# Patient Record
Sex: Female | Born: 2000 | Race: White | Hispanic: No | Marital: Single
Health system: Southern US, Community
[De-identification: ages and names within clinical notes are randomized; demographics above are authoritative.]

## PROBLEM LIST (undated history)

## (undated) ENCOUNTER — Inpatient Hospital Stay (HOSPITAL_COMMUNITY): Payer: Self-pay

## (undated) DIAGNOSIS — R000 Tachycardia, unspecified: Secondary | ICD-10-CM

## (undated) DIAGNOSIS — I951 Orthostatic hypotension: Secondary | ICD-10-CM

## (undated) DIAGNOSIS — F419 Anxiety disorder, unspecified: Secondary | ICD-10-CM

## (undated) DIAGNOSIS — G90A Postural orthostatic tachycardia syndrome (POTS): Secondary | ICD-10-CM

## (undated) DIAGNOSIS — F32A Depression, unspecified: Secondary | ICD-10-CM

## (undated) DIAGNOSIS — I498 Other specified cardiac arrhythmias: Secondary | ICD-10-CM

## (undated) DIAGNOSIS — G43909 Migraine, unspecified, not intractable, without status migrainosus: Secondary | ICD-10-CM

## (undated) DIAGNOSIS — J45909 Unspecified asthma, uncomplicated: Secondary | ICD-10-CM

## (undated) DIAGNOSIS — L509 Urticaria, unspecified: Secondary | ICD-10-CM

## (undated) HISTORY — DX: Anxiety disorder, unspecified: F41.9

## (undated) HISTORY — DX: Unspecified asthma, uncomplicated: J45.909

## (undated) HISTORY — PX: WISDOM TOOTH EXTRACTION: SHX21

## (undated) HISTORY — DX: Migraine, unspecified, not intractable, without status migrainosus: G43.909

## (undated) HISTORY — DX: Depression, unspecified: F32.A

## (undated) HISTORY — DX: Urticaria, unspecified: L50.9

---
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
## 2018-04-07 ENCOUNTER — Encounter (HOSPITAL_COMMUNITY): Payer: Self-pay

## 2018-07-04 IMAGING — US US OB COMP LESS 14 WK
1 series · 14 of 28 positions shown · non-contrast
Comparison: CT abdomen pelvis 04/06/2016.

CLINICAL DATA: Positive urine pregnancy test. Vaginal bleeding.
Lower abdominal pain.

EXAM:
OBSTETRIC <14 WK US AND TRANSVAGINAL OB US
TECHNIQUE: Both transabdominal and transvaginal ultrasound examinations were
performed for complete evaluation of the gestation as well as the
maternal uterus, adnexal regions, and pelvic cul-de-sac.
Transvaginal technique was performed to assess early pregnancy.

[Series 1: us ob comp less 14 wk · 0.20mm/px · 14 of 50 slices shown]
[im 2/50]
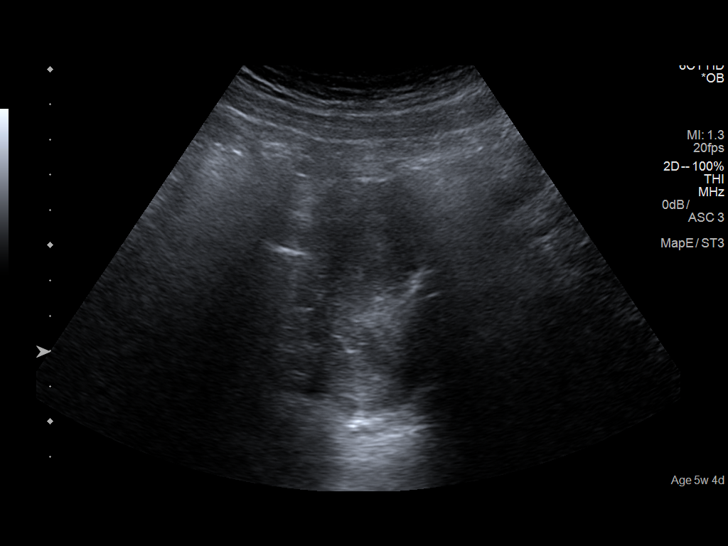
[im 6/50]
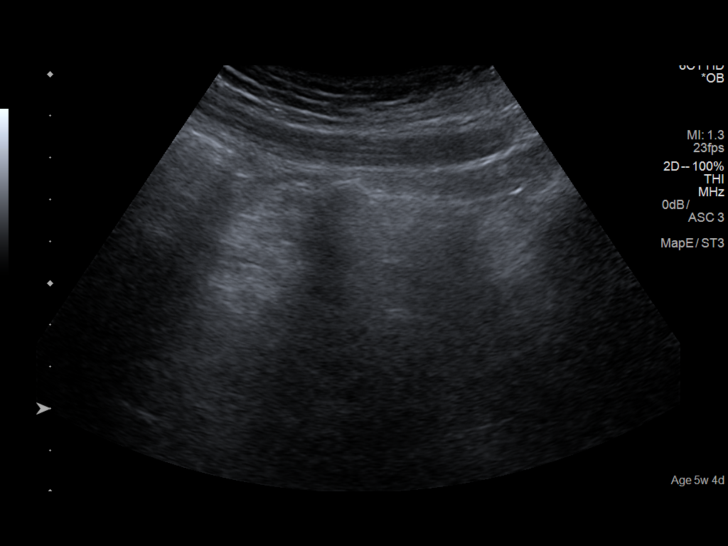
[im 10/50]
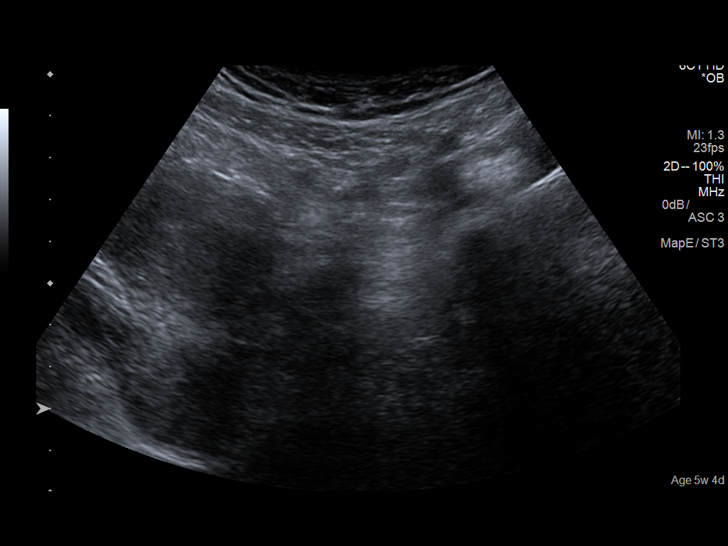
[im 13/50]
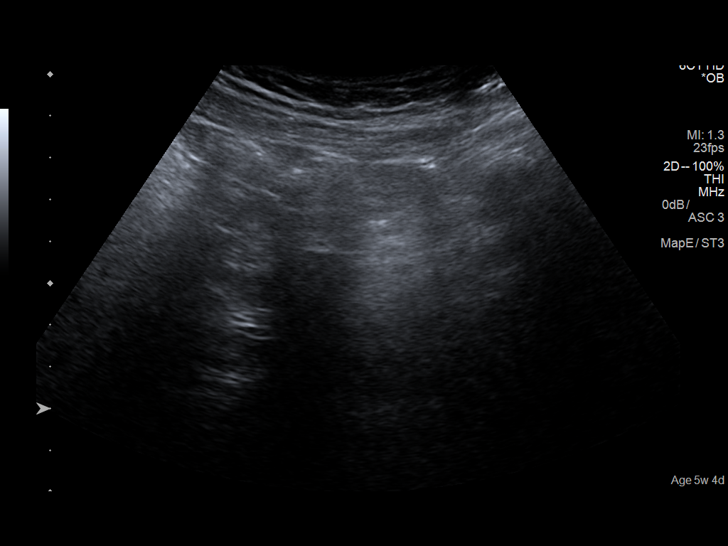
[im 17/50]
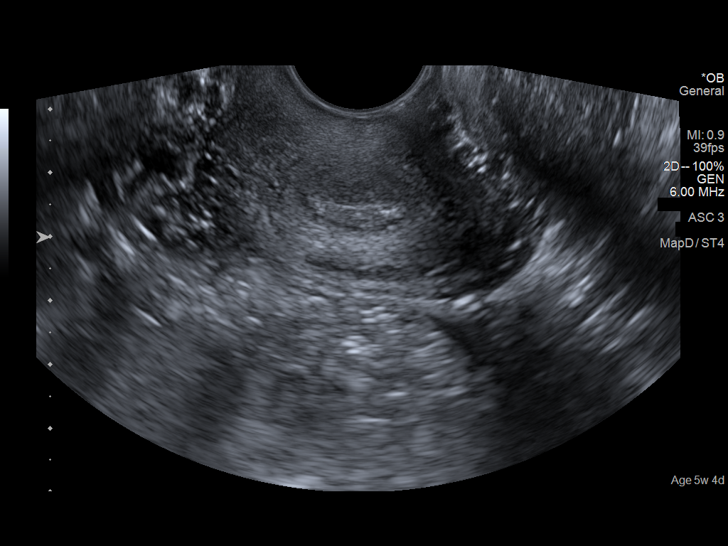
[im 20/50]
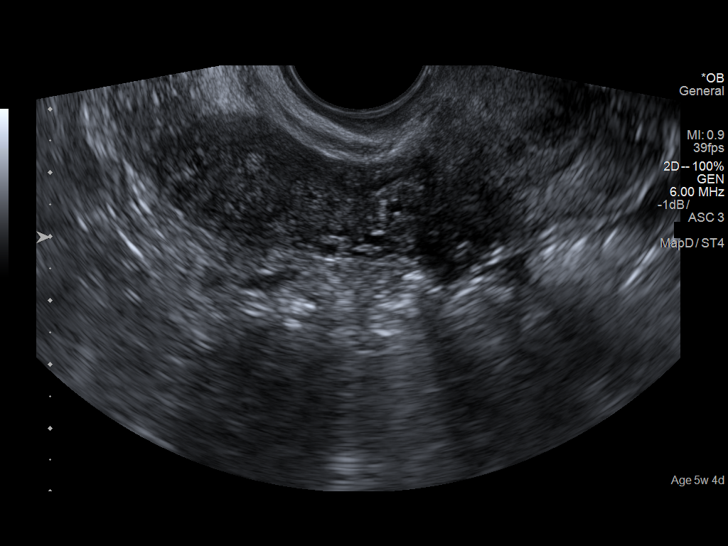
[im 24/50]
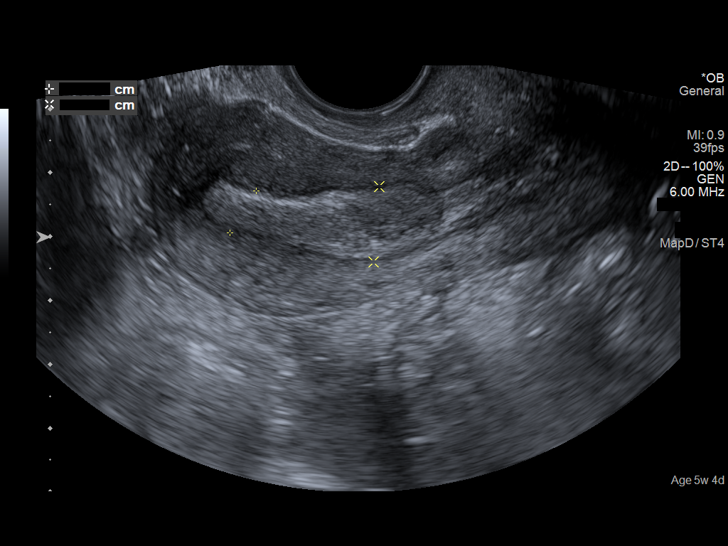
[im 28/50]
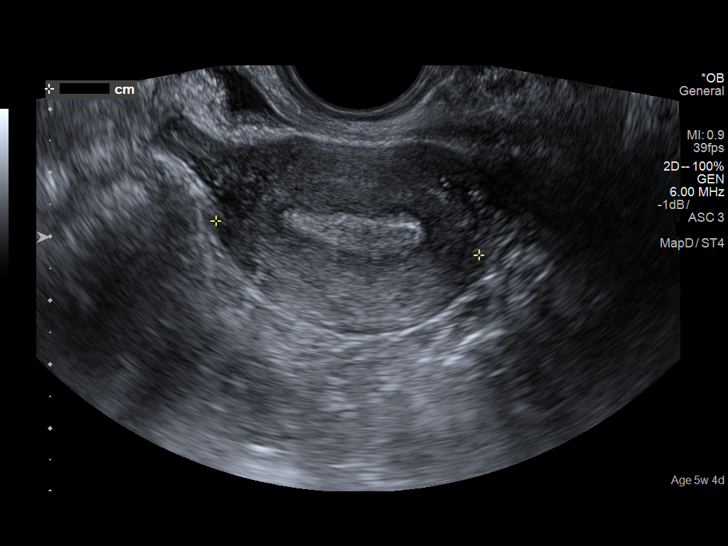
[im 31/50]
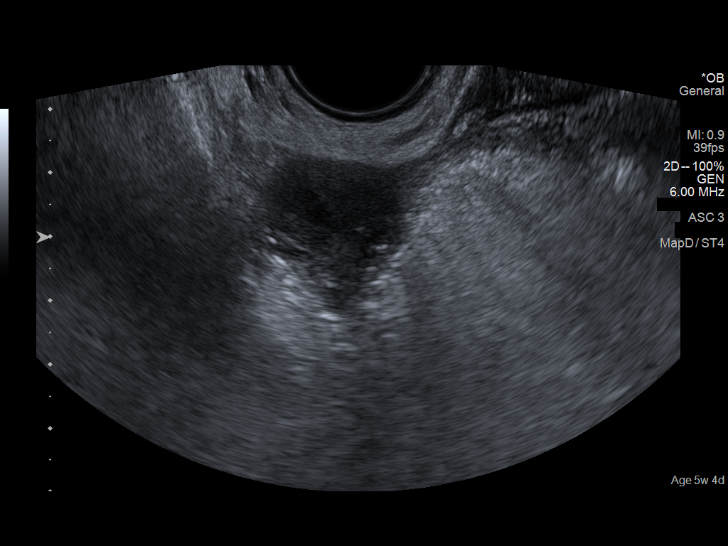
[im 35/50]
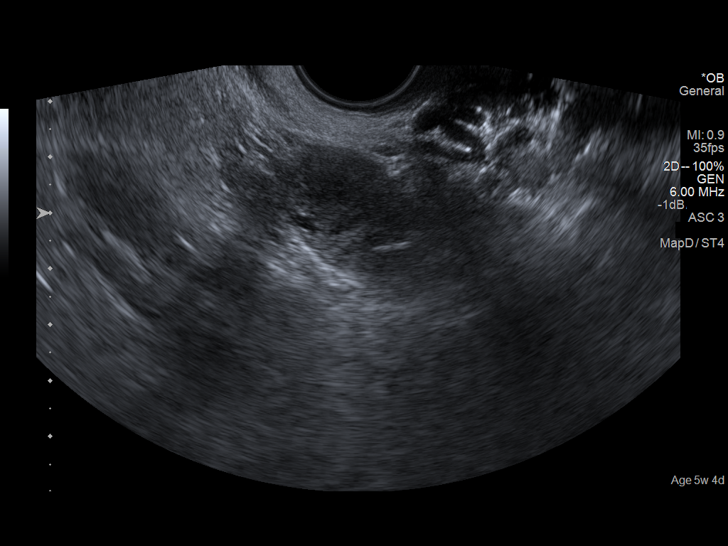
[im 39/50]
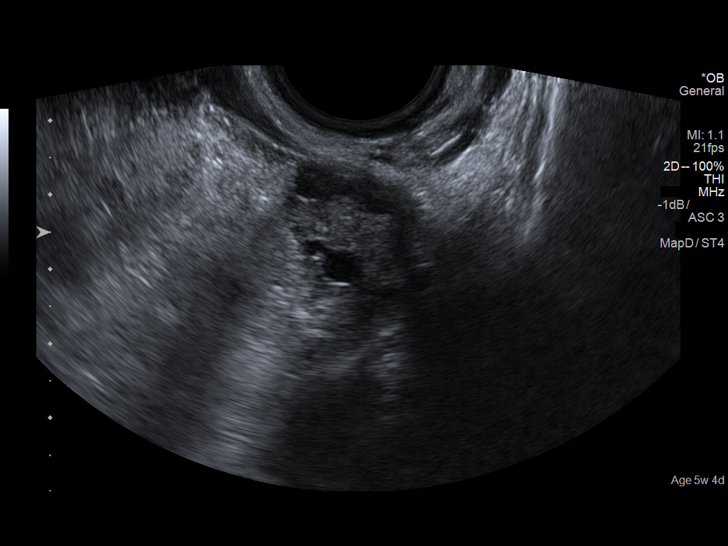
[im 42/50]
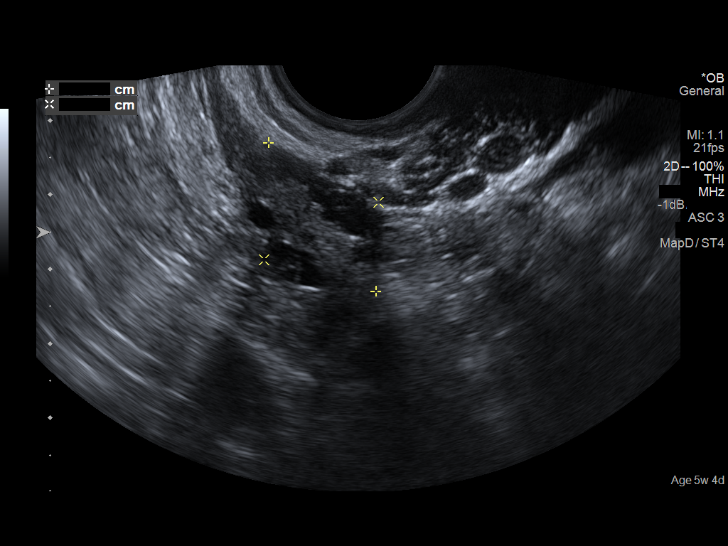
[im 46/50]
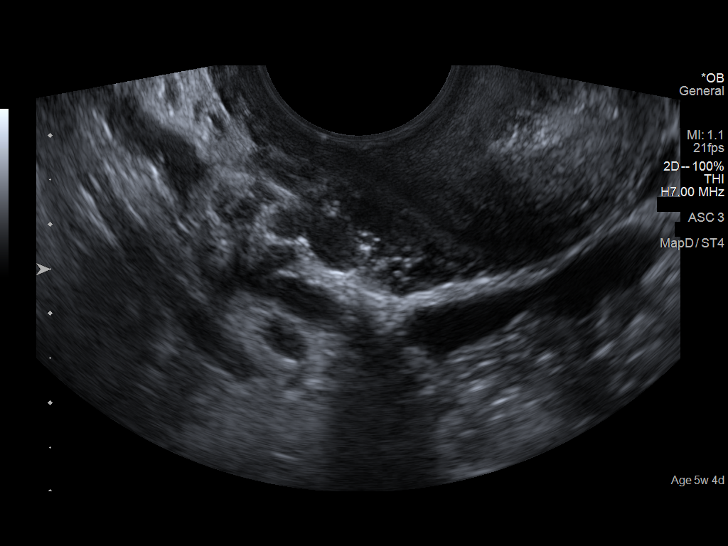
[im 50/50]
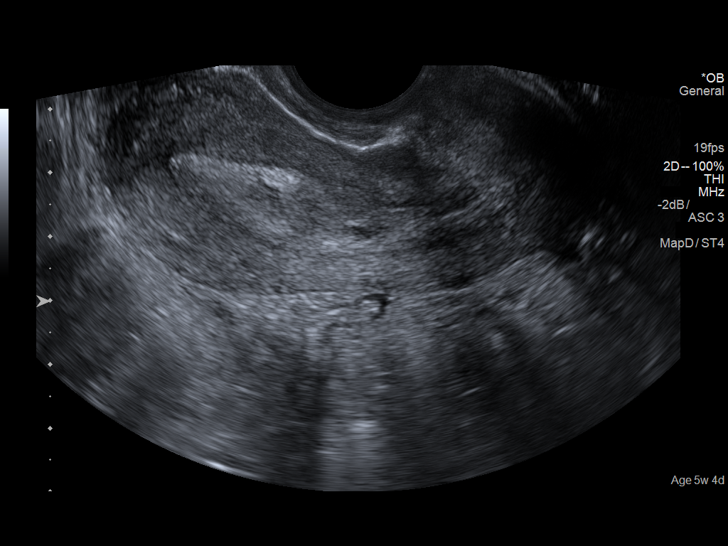

[14 of 28 positions shown; findings below may reference images not displayed]

FINDINGS: Intrauterine gestational sac: None

Yolk sac:  Not Visualized.

Embryo:  Not Visualized.

Cardiac Activity: Not Visualized.

Subchorionic hemorrhage:  None visualized.

Maternal uterus/adnexae: Normal right left ovaries. No free fluid in
the pelvis.
IMPRESSION: No intrauterine gestation identified. In the setting of positive
pregnancy test and no definite intrauterine pregnancy, this reflects
a pregnancy of unknown location. Differential considerations include
early normal IUP, abnormal IUP, or nonvisualized ectopic pregnancy.
Differentiation is achieved with serial beta HCG supplemented by
repeat sonography as clinically warranted.

## 2023-07-13 ENCOUNTER — Encounter: Payer: Self-pay | Admitting: Neurology

## 2023-07-13 ENCOUNTER — Ambulatory Visit: Payer: Medicaid Other | Admitting: Neurology

## 2023-07-13 VITALS — BP 118/81 | HR 100 | Ht 62.0 in | Wt 190.8 lb

## 2023-07-13 DIAGNOSIS — G43009 Migraine without aura, not intractable, without status migrainosus: Secondary | ICD-10-CM | POA: Diagnosis not present

## 2023-07-13 DIAGNOSIS — G459 Transient cerebral ischemic attack, unspecified: Secondary | ICD-10-CM

## 2023-07-13 DIAGNOSIS — G43109 Migraine with aura, not intractable, without status migrainosus: Secondary | ICD-10-CM | POA: Diagnosis not present

## 2023-07-13 DIAGNOSIS — R4701 Aphasia: Secondary | ICD-10-CM

## 2023-07-13 DIAGNOSIS — G43409 Hemiplegic migraine, not intractable, without status migrainosus: Secondary | ICD-10-CM

## 2023-07-13 DIAGNOSIS — G8191 Hemiplegia, unspecified affecting right dominant side: Secondary | ICD-10-CM | POA: Diagnosis not present

## 2023-07-13 DIAGNOSIS — R519 Headache, unspecified: Secondary | ICD-10-CM

## 2023-07-13 MED ORDER — QULIPTA 60 MG PO TABS
60.0000 mg | ORAL_TABLET | Freq: Every day | ORAL | 0 refills | Status: DC
Start: 2023-07-13 — End: 2023-08-14

## 2023-07-13 MED ORDER — UBRELVY 100 MG PO TABS
100.0000 mg | ORAL_TABLET | ORAL | 0 refills | Status: DC | PRN
Start: 2023-07-13 — End: 2023-09-12

## 2023-07-13 NOTE — Patient Instructions (Signed)
MRI of the brain and MRA of the head Qulipta daily for migraine and headache prevention Ubrelvy as needed  Atogepant Tablets What is this medication? ATOGEPANT (a TOE je pant) prevents migraines. It works by blocking a substance in the body that causes migraines. This medicine may be used for other purposes; ask your health care provider or pharmacist if you have questions. COMMON BRAND NAME(S): QULIPTA What should I tell my care team before I take this medication? They need to know if you have any of these conditions: Kidney disease Liver disease An unusual or allergic reaction to atogepant, other medications, foods, dyes, or preservatives Pregnant or trying to get pregnant Breast-feeding How should I use this medication? Take this medication by mouth with water. Take it as directed on the prescription label at the same time every day. You can take it with or without food. If it upsets your stomach, take it with food. Keep taking it unless your care team tells you to stop. Talk to your care team about the use of this medication in children. Special care may be needed. Overdosage: If you think you have taken too much of this medicine contact a poison control center or emergency room at once. NOTE: This medicine is only for you. Do not share this medicine with others. What if I miss a dose? If you miss a dose, take it as soon as you can. If it is almost time for your next dose, take only that dose. Do not take double or extra doses. What may interact with this medication? Carbamazepine Certain medications for fungal infections, such as itraconazole, ketoconazole Clarithromycin Cyclosporine Efavirenz Etravirine Phenytoin Rifampin St. John's wort This list may not describe all possible interactions. Give your health care provider a list of all the medicines, herbs, non-prescription drugs, or dietary supplements you use. Also tell them if you smoke, drink alcohol, or use illegal drugs.  Some items may interact with your medicine. What should I watch for while using this medication? Visit your care team for regular checks on your progress. Tell your care team if your symptoms do not start to get better or if they get worse. What side effects may I notice from receiving this medication? Side effects that you should report to your care team as soon as possible: Allergic reactions--skin rash, itching, hives, swelling of the face, lips, tongue, or throat Side effects that usually do not require medical attention (report to your care team if they continue or are bothersome): Constipation Fatigue Loss of appetite with weight loss Nausea This list may not describe all possible side effects. Call your doctor for medical advice about side effects. You may report side effects to FDA at 1-800-FDA-1088. Where should I keep my medication? Keep out of the reach of children and pets. Store at room temperature between 20 and 25 degrees C (68 and 77 degrees F). Get rid of any unused medication after the expiration date. To get rid of medications that are no longer needed or have expired: Take the medication to a medication take-back program. Check with your pharmacy or law enforcement to find a location. If you cannot return the medication, check the label or package insert to see if the medication should be thrown out in the garbage or flushed down the toilet. If you are not sure, ask your care team. If it is safe to put it in the trash, take the medication out of the container. Mix the medication with cat litter, dirt, coffee grounds,  or other unwanted substance. Seal the mixture in a bag or container. Put it in the trash. NOTE: This sheet is a summary. It may not cover all possible information. If you have questions about this medicine, talk to your doctor, pharmacist, or health care provider.  2024 Elsevier/Gold Standard (2021-11-15 00:00:00)  Ubrogepant Tablets What is this  medication? UBROGEPANT (ue BROE je pant) treats migraines. It works by blocking a substance in the body that causes migraines. It is not used to prevent migraines. This medicine may be used for other purposes; ask your health care provider or pharmacist if you have questions. COMMON BRAND NAME(S): Bernita Raisin What should I tell my care team before I take this medication? They need to know if you have any of these conditions: Kidney disease Liver disease An unusual or allergic reaction to ubrogepant, other medications, foods, dyes, or preservatives Pregnant or trying to get pregnant Breast-feeding How should I use this medication? Take this medication by mouth with a glass of water. Take it as directed on the prescription label. You can take it with or without food. If it upsets your stomach, take it with food. Keep taking it unless your care team tells you to stop. Talk to your care team about the use of this medication in children. Special care may be needed. Overdosage: If you think you have taken too much of this medicine contact a poison control center or emergency room at once. NOTE: This medicine is only for you. Do not share this medicine with others. What if I miss a dose? This does not apply. This medication is not for regular use. What may interact with this medication? Do not take this medication with any of the following: Adagrasib Ceritinib Certain antibiotics, such as chloramphenicol, clarithromycin, telithromycin Certain antivirals for HIV, such as atazanavir, cobicistat, darunavir, delavirdine, fosamprenavir, indinavir, ritonavir Certain medications for fungal infections, such as itraconazole, ketoconazole, posaconazole, voriconazole Conivaptan Grapefruit Idelalisib Mifepristone Nefazodone Ribociclib This medication may also interact with the following: Carvedilol Certain medications for seizures, such as phenobarbital,  phenytoin Ciprofloxacin Cyclosporine Eltrombopag Fluconazole Fluvoxamine Quinidine Rifampin St. John's wort Verapamil This list may not describe all possible interactions. Give your health care provider a list of all the medicines, herbs, non-prescription drugs, or dietary supplements you use. Also tell them if you smoke, drink alcohol, or use illegal drugs. Some items may interact with your medicine. What should I watch for while using this medication? Visit your care team for regular checks on your progress. Tell your care team if your symptoms do not start to get better or if they get worse. Your mouth may get dry. Chewing sugarless gum or sucking hard candy and drinking plenty of water may help. Contact your care team if the problem does not go away or is severe. What side effects may I notice from receiving this medication? Side effects that you should report to your care team as soon as possible: Allergic reactions--skin rash, itching, hives, swelling of the face, lips, tongue, or throat Side effects that usually do not require medical attention (report to your care team if they continue or are bothersome): Drowsiness Dry mouth Fatigue Nausea This list may not describe all possible side effects. Call your doctor for medical advice about side effects. You may report side effects to FDA at 1-800-FDA-1088. Where should I keep my medication? Keep out of the reach of children and pets. Store between 15 and 30 degrees C (59 and 86 degrees F). Get rid of any unused  medication after the expiration date. To get rid of medications that are no longer needed or have expired: Take the medication to a medication take-back program. Check with your pharmacy or law enforcement to find a location. If you cannot return the medication, check the label or package insert to see if the medication should be thrown out in the garbage or flushed down the toilet. If you are not sure, ask your care team. If it  is safe to put it in the trash, pour the medication out of the container. Mix the medication with cat litter, dirt, coffee grounds, or other unwanted substance. Seal the mixture in a bag or container. Put it in the trash. NOTE: This sheet is a summary. It may not cover all possible information. If you have questions about this medicine, talk to your doctor, pharmacist, or health care provider.  2024 Elsevier/Gold Standard (2021-11-19 00:00:00)

## 2023-07-13 NOTE — Progress Notes (Signed)
[facility]    Provider:  Dr Lucia Gaskins Requesting Provider: Hadley Pen, MD Primary Care Provider:  Hadley Pen, MD  CC:  migraines  HPI:  Tracy Glenn is a 22 y.o. female here as requested by Hadley Pen, MD for Migraines. has Migraine without aura, intractable; Tension headache; Medication overuse headache; Circadian rhythm sleep disorder, irregular sleep wake type; Anxiety state; Depression; Migraine with aura; Esophageal reflux; Allergic urticaria; Allergic reaction; Migraine without aura and without status migrainosus, not intractable; and Hemiplegic migraine without status migrainosus, not intractable on their problem list.   She has 6 kids, 3 of her own 5 and under and 3 older she is raising. She has been worsening for at least a year ib the setting of a large family but now that thngs are better still having worse migraines, triggers are loud persistent noise and smells can make worse or trigger, she has nausea, pulsating/pounding/throbbing on the left unilateral. She has hemiplegic migraines she has had them multiple times she gets a left-sided headache and right-sided numbness and weakness and aphasia, can;t et a sentence out, her eye become glassy and then followed by a severe migraine, loses peripheral vision like seieng through a straw, one week she had 3 of the same episodes. She had hemiplegic the first time with her 24 year old she was [redacted] weeks pregnant. Benadryl, zofran, advil dual action,  She gets headaches 10 days a month. She gets a total of migraine 6 days a month which includes hemiplegic migraines 2x a month. Migraines can be moderate to severe, last 4 hours to 24 hours,  She is not planning on any other children. They use condoms and never have sex without birth control and aren't planning on more children for at least 3 years. No other focal neurologic deficits, associated symptoms, inciting events or modifiable factors.  Reviewed notes,  labs and imaging from outside physicians, which showed:  Number thorough review of records and patient self-reports, medications tried > 3 months: propranolol, prozac, magnesium, zofran, sertraline, topiramate. Triptans are contraindicated due to hemiplegic migraines. Imitrex.      Latest Ref Rng & Units 07/13/2023    4:06 PM 07/25/2016    1:40 PM  CBC  WBC 3.4 - 10.8 x10E3/uL 8.8  7.8   Hemoglobin 11.1 - 15.9 g/dL 52.8  41.3   Hematocrit 34.0 - 46.6 % 39.1  38.5   Platelets 150 - 450 x10E3/uL 346  249       Latest Ref Rng & Units 07/13/2023    4:06 PM 07/25/2016    1:40 PM  CMP  Glucose 70 - 99 mg/dL 84  97   BUN 6 - 20 mg/dL 12  <5   Creatinine 2.44 - 1.00 mg/dL 0.10  2.72   Sodium 536 - 144 mmol/L 141  141   Potassium 3.5 - 5.2 mmol/L 4.7  3.9   Chloride 96 - 106 mmol/L 105  111   CO2 20 - 29 mmol/L 22  25   Calcium 8.7 - 10.2 mg/dL 9.1  8.7   Total Protein 6.0 - 8.5 g/dL 6.3  6.2   Total Bilirubin 0.0 - 1.2 mg/dL <6.4  0.4   Alkaline Phos 44 - 121 IU/L 83  37   AST 0 - 40 IU/L 15  15   ALT 0 - 32 IU/L 17  10      Review of Systems: Patient complains of symptoms per HPI as well as the following symptoms  none. Pertinent negatives and positives per HPI. All others negative.   Social History   Socioeconomic History   Marital status: Single    Spouse name: Not on file   Number of children: Not on file   Years of education: Not on file   Highest education level: Not on file  Occupational History   Not on file  Tobacco Use   Smoking status: Never    Passive exposure: Yes   Smokeless tobacco: Never  Vaping Use   Vaping status: Never Used  Substance and Sexual Activity   Alcohol use: No   Drug use: No   Sexual activity: Yes  Other Topics Concern   Not on file  Social History Narrative   Caffiene 1 soda daily   Working home maker  3 kids (5,3,2y)   Watches 3 other children (nieces/nephews)   Stressors: family (brother and sister substance abuse)    Social  Determinants of Health   Financial Resource Strain: Not on file  Food Insecurity: Low Risk  (04/19/2023)   Received from [facility]   Hunger Vital Sign    Worried About Running Out of Food in the Last Year: Never true    Ran Out of Food in the Last Year: Never true  Transportation Needs: Not on file (04/19/2023)  Physical Activity: Not on file  Stress: Not on file  Social Connections: Not on file  Intimate Partner Violence: Not At Risk (12/10/2021)   Received from [facility] visits prior to 12/10/2022., [facility] visits prior to 12/10/2022.   Humiliation, Afraid, Rape, and Kick questionnaire    Fear of Current or Ex-Partner: No    Emotionally Abused: No    Physically Abused: No    Sexually Abused: No    Family History  Problem Relation Age of Onset   Migraines Father    Bipolar disorder Sister    Migraines Brother    Migraines Paternal Grandfather    Depression Cousin    Asthma Cousin    Migraines Paternal Aunt    Allergic rhinitis Maternal Uncle     Past Medical History:  Diagnosis Date   Anxiety and depression    Asthma    Migraines    POTS (postural orthostatic tachycardia syndrome)    Urticaria     Patient Active Problem List   Diagnosis Date Noted   Migraine without aura and without status migrainosus, not intractable 07/16/2023   Hemiplegic migraine without status migrainosus, not intractable 07/16/2023   Esophageal reflux 08/26/2015   Allergic urticaria 08/26/2015   Allergic reaction 08/26/2015   Migraine without aura, intractable 01/28/2014   Tension headache 01/28/2014   Medication overuse headache 01/28/2014   Circadian rhythm sleep disorder, irregular sleep wake type 01/28/2014   Anxiety state 01/28/2014   Depression 01/28/2014   Migraine with aura 01/28/2014    Past Surgical History:  Procedure Laterality Date   WISDOM TOOTH EXTRACTION      Current Outpatient Medications  Medication Sig Dispense  Refill   albuterol (PROAIR HFA) 108 (90 BASE) MCG/ACT inhaler Inhale 2 puffs into the lungs every 4 (four) hours as needed for wheezing or shortness of breath.     Atogepant (QULIPTA) 60 MG TABS Take 1 tablet (60 mg total) by mouth daily. 14 tablet 0   diphenhydrAMINE (BENADRYL) 25 mg capsule Take 25 mg by mouth every 6 (six) hours as needed.     ibuprofen (ADVIL,MOTRIN) 600 MG tablet Take 600 mg by mouth  every 6 (six) hours as needed.     loratadine (CLARITIN) 10 MG tablet Take 1 tablet (10 mg total) by mouth 2 (two) times daily. 60 tablet 5   Melatonin 3 MG TABS Take 3 mg by mouth at bedtime as needed.     ondansetron (ZOFRAN-ODT) 4 MG disintegrating tablet dissolve 1 tablet ON TONGUE every 6 hours if needed for nausea and vomiting  0   Prenatal Vit-Fe Fumarate-FA (PRENATAL MULTIVITAMIN) TABS tablet Take 1 tablet daily at 12 noon by mouth.     riboflavin (VITAMIN B-2) 100 MG TABS tablet Take 100 mg by mouth daily.     sertraline (ZOLOFT) 50 MG tablet Take 50 mg by mouth daily.     Ubrogepant (UBRELVY) 100 MG TABS Take 1 tablet (100 mg total) by mouth every 2 (two) hours as needed. Maximum 200mg  a day. 6 tablet 0   No current facility-administered medications for this visit.    Allergies as of 07/13/2023 - Review Complete 07/13/2023  Allergen Reaction Noted   Fluoxetine  07/13/2023   Other  01/28/2014   Zithromax [azithromycin] Hives 08/24/2017   Amoxicillin Rash 01/28/2014    Vitals: BP 118/81   Pulse 100   Ht 5\' 2"  (1.575 m)   Wt 190 lb 12.8 oz (86.5 kg)   BMI 34.90 kg/m  Last Weight:  Wt Readings from Last 1 Encounters:  07/13/23 190 lb 12.8 oz (86.5 kg)   Last Height:   Ht Readings from Last 1 Encounters:  07/13/23 5\' 2"  (1.575 m)     Physical exam: Exam: Gen: NAD, conversant, well nourised, obese, well groomed                     CV: RRR, no MRG. No Carotid Bruits. No peripheral edema, warm, nontender Eyes: Conjunctivae clear without exudates or  hemorrhage  Neuro: Detailed Neurologic Exam  Speech:    Speech is normal; fluent and spontaneous with normal comprehension.  Cognition:    The patient is oriented to person, place, and time;     recent and remote memory intact;     language fluent;     normal attention, concentration,     fund of knowledge Cranial Nerves:    The pupils are equal, round, and reactive to light. The fundi are normal and spontaneous venous pulsations are present. Visual fields are full to finger confrontation. Extraocular movements are intact. Trigeminal sensation is intact and the muscles of mastication are normal. The face is symmetric. The palate elevates in the midline. Hearing intact. Voice is normal. Shoulder shrug is normal. The tongue has normal motion without fasciculations.   Coordination:    Normal finger to nose and heel to shin. Normal rapid alternating movements.   Gait:    Heel-toe and tandem gait are normal.   Motor Observation:    No asymmetry, no atrophy, and no involuntary movements noted. Tone:    Normal muscle tone.    Posture:    Posture is normal. normal erect    Strength:    Strength is V/V in the upper and lower limbs.      Sensation: intact to LT     Reflex Exam:  DTR's:    Deep tendon reflexes in the upper and lower extremities are normal bilaterally.   Toes:    The toes are downgoing bilaterally.   Clonus:    Clonus is absent.    Assessment/Plan: Patient with hemiplegic migraine, we spent an extended visit today discussing  migraines, migraine management, migraine aura, hemiplegic migraines, pathophysiology, acute and preventative medical management, lifestyle factors and genetics of hemiplegic migraines.  MRI of the brain w/wo contrast and MRA of the head due to repeated hemiplegic attacks to evaluate for strokes, multiple sclerosis, space-occupying masses, vascular, embolic, TIAs may very well be hemiplegic migraines however that is a diagnosis of exclusion and  need to rule out all other causes especially strokes.  Blood work today  Will start Merchant navy officer and Arts development officer  Orders Placed This Encounter  Procedures   MR BRAIN W WO CONTRAST   MR ANGIO HEAD WO CONTRAST   TSH Rfx on Abnormal to Free T4   Hemoglobin A1c   Lipid Panel   Comprehensive metabolic panel   CBC with Differential/Platelets   Meds ordered this encounter  Medications   Atogepant (QULIPTA) 60 MG TABS    Sig: Take 1 tablet (60 mg total) by mouth daily.    Dispense:  14 tablet    Refill:  0   Ubrogepant (UBRELVY) 100 MG TABS    Sig: Take 1 tablet (100 mg total) by mouth every 2 (two) hours as needed. Maximum 200mg  a day.    Dispense:  6 tablet    Refill:  0   To prevent or relieve headaches, try the following: Cool Compress. Lie down and place a cool compress on your head.  Avoid headache triggers. If certain foods or odors seem to have triggered your migraines in the past, avoid them. A headache diary might help you identify triggers.  Include physical activity in your daily routine. Try a daily walk or other moderate aerobic exercise.  Manage stress. Find healthy ways to cope with the stressors, such as delegating tasks on your to-do list.  Practice relaxation techniques. Try deep breathing, yoga, massage and visualization.  Eat regularly. Eating regularly scheduled meals and maintaining a healthy diet might help prevent headaches. Also, drink plenty of fluids.  Follow a regular sleep schedule. Sleep deprivation might contribute to headaches Consider biofeedback. With this mind-body technique, you learn to control certain bodily functions -- such as muscle tension, heart rate and blood pressure -- to prevent headaches or reduce headache pain.    Proceed to emergency room if you experience new or worsening symptoms or symptoms do not resolve, if you have new neurologic symptoms or if headache is severe, or for any concerning symptom.   Provided education and documentation from  American headache Society toolbox including articles on: chronic migraine medication overuse headache, chronic migraines, prevention of migraines, behavioral and other nonpharmacologic treatments for headache.   Discussed:  There is increased risk for stroke in women with migraine with aura and a contraindication for the combined contraceptive pill for use by women who have migraine with aura. The risk for women with migraine without aura is lower. However other risk factors like smoking are far more likely to increase stroke risk than migraine. There is a recommendation for no smoking and for the use of OCPs without estrogen such as progestogen only pills particularly for women with migraine with aura.Marland Kitchen People who have migraine headaches with auras may be 3 times more likely to have a stroke caused by a blood clot, compared to migraine patients who don't see auras. Women who take hormone-replacement therapy may be 30 percent more likely to suffer a clot-based stroke than women not taking medication containing estrogen. Other risk factors like smoking and high blood pressure may be  much more important. And stroke is still a  rare complication due to migraine aura and is controversial and lower doses may not cause a risk.  When discussing birth control remember to mention migraine auras.     Cc: Hadley Pen, MD,  Hadley Pen, MD  Naomie Dean, MD  [facility] [address] [place], [place] [address]  Phone [phone] Fax [phone]  I spent over 60 minutes of face-to-face and non-face-to-face time with patient on the  1. Hemiplegic migraine without status migrainosus, not intractable   2. Hemiplegia affecting right dominant side, unspecified etiology, unspecified hemiplegia type (HCC)   3. Migraine with aura and without status migrainosus, not intractable   4. Migraine without aura and without status migrainosus, not intractable   5. Aphasia    6. Worsening headaches   7. TIA (transient ischemic attack)    diagnosis.  This included previsit chart review, lab review, study review, order entry, electronic health record documentation, patient education on the different diagnostic and therapeutic options, counseling and coordination of care, risks and benefits of management, compliance, or risk factor reduction

## 2023-07-14 LAB — CBC WITH DIFFERENTIAL/PLATELET
Basophils Absolute: 0.1 10*3/uL (ref 0.0–0.2)
Basos: 1 %
EOS (ABSOLUTE): 0.1 10*3/uL (ref 0.0–0.4)
Eos: 2 %
Hematocrit: 39.1 % (ref 34.0–46.6)
Hemoglobin: 12.8 g/dL (ref 11.1–15.9)
Immature Grans (Abs): 0 10*3/uL (ref 0.0–0.1)
Immature Granulocytes: 0 %
Lymphocytes Absolute: 1.6 10*3/uL (ref 0.7–3.1)
Lymphs: 18 %
MCH: 29.1 pg (ref 26.6–33.0)
MCHC: 32.7 g/dL (ref 31.5–35.7)
MCV: 89 fL (ref 79–97)
Monocytes Absolute: 0.8 10*3/uL (ref 0.1–0.9)
Monocytes: 9 %
Neutrophils Absolute: 6.2 10*3/uL (ref 1.4–7.0)
Neutrophils: 70 %
Platelets: 346 10*3/uL (ref 150–450)
RBC: 4.4 x10E6/uL (ref 3.77–5.28)
RDW: 12.4 % (ref 11.7–15.4)
WBC: 8.8 10*3/uL (ref 3.4–10.8)

## 2023-07-14 LAB — COMPREHENSIVE METABOLIC PANEL
ALT: 17 [IU]/L (ref 0–32)
AST: 15 [IU]/L (ref 0–40)
Albumin: 4.2 g/dL (ref 4.0–5.0)
Alkaline Phosphatase: 83 [IU]/L (ref 44–121)
BUN/Creatinine Ratio: 19 (ref 9–23)
BUN: 12 mg/dL (ref 6–20)
Bilirubin Total: <0.2 mg/dL (ref 0.0–1.2)
CO2: 22 mmol/L (ref 20–29)
Calcium: 9.1 mg/dL (ref 8.7–10.2)
Chloride: 105 mmol/L (ref 96–106)
Creatinine, Ser: 0.62 mg/dL (ref 0.57–1.00)
Globulin, Total: 2.1 g/dL (ref 1.5–4.5)
Glucose: 84 mg/dL (ref 70–99)
Potassium: 4.7 mmol/L (ref 3.5–5.2)
Sodium: 141 mmol/L (ref 134–144)
Total Protein: 6.3 g/dL (ref 6.0–8.5)
eGFR: 127 mL/min/{1.73_m2} (ref >59)

## 2023-07-14 LAB — HEMOGLOBIN A1C
Est. average glucose Bld gHb Est-mCnc: 97 mg/dL
Hgb A1c MFr Bld: 5 % (ref 4.8–5.6)

## 2023-07-14 LAB — TSH RFX ON ABNORMAL TO FREE T4: TSH: 2.6 u[IU]/mL (ref 0.450–4.500)

## 2023-07-14 LAB — LIPID PANEL
Chol/HDL Ratio: 2.7 {ratio} (ref 0.0–4.4)
Cholesterol, Total: 138 mg/dL (ref 100–199)
HDL: 51 mg/dL (ref >39)
LDL Chol Calc (NIH): 50 mg/dL (ref 0–99)
Triglycerides: 237 mg/dL — ABNORMAL HIGH (ref 0–149)
VLDL Cholesterol Cal: 37 mg/dL (ref 5–40)

## 2023-07-16 ENCOUNTER — Encounter: Payer: Self-pay | Admitting: Neurology

## 2023-07-16 DIAGNOSIS — G43409 Hemiplegic migraine, not intractable, without status migrainosus: Secondary | ICD-10-CM | POA: Insufficient documentation

## 2023-07-16 DIAGNOSIS — G43009 Migraine without aura, not intractable, without status migrainosus: Secondary | ICD-10-CM | POA: Insufficient documentation

## 2023-07-17 ENCOUNTER — Other Ambulatory Visit (HOSPITAL_COMMUNITY): Payer: Self-pay

## 2023-07-17 ENCOUNTER — Telehealth: Payer: Self-pay | Admitting: *Deleted

## 2023-07-17 ENCOUNTER — Telehealth: Payer: Self-pay

## 2023-07-17 NOTE — Telephone Encounter (Signed)
Pharmacy Patient Advocate Encounter   Received notification from Physician's Office that prior authorization for Ubrelvy 100MG  tablets is required/requested.   Insurance verification completed.   The patient is insured through [facility] .   Per test claim: PA required; PA submitted to [facility] via CoverMyMeds Key/confirmation #/EOC [id number] Status is pending

## 2023-07-17 NOTE — Telephone Encounter (Signed)
Bernita Raisin PA needed.

## 2023-07-17 NOTE — Telephone Encounter (Signed)
Pharmacy Patient Advocate Encounter   Received notification from Physician's Office that prior authorization for Qulipta 60MG  tablets is required/requested.   Insurance verification completed.   The patient is insured through [facility] .   Per test claim: PA required; PA submitted to [facility] via CoverMyMeds Key/confirmation #/EOC [id number] Status is pending

## 2023-07-17 NOTE — Telephone Encounter (Signed)
PA request has been Submitted. New Encounter created for follow up. For additional info see Pharmacy Prior Auth telephone encounter from 07/17/2023. Also submitted PA for Qulipta.

## 2023-07-19 ENCOUNTER — Telehealth: Payer: Self-pay | Admitting: Neurology

## 2023-07-19 ENCOUNTER — Other Ambulatory Visit (HOSPITAL_COMMUNITY): Payer: Self-pay

## 2023-07-19 NOTE — Telephone Encounter (Signed)
Pt sent this:  Tracy Glenn  You1 minute ago (11:12 AM)   "I have headaches usually every other day plus the 6 migraines "  So we are looking at over 15 headache days a month. We probably need to appeal this because triptans are contraindicated due to hemiplegic migraines.

## 2023-07-19 NOTE — Telephone Encounter (Signed)
Pharmacy Patient Advocate Encounter  Received notification from [facility] that Prior Authorization for Ubrelvy 100MG  tablets has been DENIED.  Full denial letter will be uploaded to the media tab. See denial reason below.   PA #/Case ID/Reference #: PA Case ID #: [id number]  So for this one-the chart note was a little confusing-I see there are headache days and Migraine days that would equal 16 headache days monthly combined but if the migraine days are include din the 10 headache days monthly that would fall under 15 days a month.

## 2023-07-19 NOTE — Telephone Encounter (Signed)
Pharmacy Patient Advocate Encounter  Received notification from [facility] that Prior Authorization for Qulipta 60MG  tablets has been APPROVED from 07/17/2023 to 07/16/2024. Ran test claim, Copay is $unable to obtain due to Refill Too Soon Rejection-Last filled on 07/19/2023-nect fill date is 07/30/2023. This test claim was processed through [facility]- copay amounts may vary at other pharmacies due to pharmacy/plan contracts, or as the patient moves through the different stages of their insurance plan.   PA #/Case ID/Reference #: PA Case ID #: [id number]  The PA was done for only qty of 14 tabs as it is in the chart.

## 2023-07-19 NOTE — Telephone Encounter (Signed)
MRI brain [facility] [place]: [id number] exp. 07/19/23-09/16/23  MRA head [facility] [place] auth: [id number] exp. 07/19/23-09/16/23 sent to GI [phone]

## 2023-07-27 ENCOUNTER — Other Ambulatory Visit: Payer: Self-pay | Admitting: Neurology

## 2023-07-27 DIAGNOSIS — G43009 Migraine without aura, not intractable, without status migrainosus: Secondary | ICD-10-CM

## 2023-07-27 MED ORDER — TOPIRAMATE 50 MG PO TABS
ORAL_TABLET | ORAL | 3 refills | Status: DC
Start: 2023-07-27 — End: 2023-09-12

## 2023-08-11 ENCOUNTER — Other Ambulatory Visit: Payer: Self-pay | Admitting: Neurology

## 2023-08-11 DIAGNOSIS — G43109 Migraine with aura, not intractable, without status migrainosus: Secondary | ICD-10-CM

## 2023-08-14 NOTE — Telephone Encounter (Signed)
Rx refilled. Patient to take once daily.

## 2023-09-05 ENCOUNTER — Ambulatory Visit
Admission: RE | Admit: 2023-09-05 | Discharge: 2023-09-05 | Disposition: A | Discharge status: Home / Self Care | Payer: Medicaid Other | Source: Ambulatory Visit | Attending: Neurology | Admitting: Neurology

## 2023-09-05 DIAGNOSIS — G8191 Hemiplegia, unspecified affecting right dominant side: Secondary | ICD-10-CM

## 2023-09-05 DIAGNOSIS — G459 Transient cerebral ischemic attack, unspecified: Secondary | ICD-10-CM | POA: Diagnosis not present

## 2023-09-05 DIAGNOSIS — R4701 Aphasia: Secondary | ICD-10-CM

## 2023-09-05 DIAGNOSIS — R519 Headache, unspecified: Secondary | ICD-10-CM

## 2023-09-05 MED ORDER — GADOPICLENOL 0.5 MMOL/ML IV SOLN
9.0000 mL | Freq: Once | INTRAVENOUS | Status: AC | PRN
  Administered 2023-09-05: 9 mL via INTRAVENOUS

## 2023-09-12 ENCOUNTER — Telehealth: Payer: Self-pay | Admitting: Neurology

## 2023-09-12 ENCOUNTER — Telehealth (INDEPENDENT_AMBULATORY_CARE_PROVIDER_SITE_OTHER): Payer: Medicaid Other | Admitting: Neurology

## 2023-09-12 ENCOUNTER — Encounter: Payer: Self-pay | Admitting: Neurology

## 2023-09-12 DIAGNOSIS — G43009 Migraine without aura, not intractable, without status migrainosus: Secondary | ICD-10-CM

## 2023-09-12 MED ORDER — VENLAFAXINE HCL ER 37.5 MG PO CP24
37.5000 mg | ORAL_CAPSULE | Freq: Every day | ORAL | 11 refills | Status: DC
Start: 2023-09-12 — End: 2023-11-09

## 2023-09-12 MED ORDER — TOPIRAMATE 100 MG PO TABS
100.0000 mg | ORAL_TABLET | Freq: Every evening | ORAL | 4 refills | Status: DC
Start: 2023-09-12 — End: 2024-03-14

## 2023-09-12 NOTE — Telephone Encounter (Signed)
Please call for 6 month follow up with an NP, med check can be virtual. thanks

## 2023-09-12 NOTE — Progress Notes (Signed)
[facility]    Provider:  Dr Lucia Gaskins Requesting Provider: Hadley Pen, MD Primary Care Provider:  Hadley Pen, MD  CC:  migraines  Virtual Visit via Video Note  I connected with Tracy Glenn on 09/12/2023 at 11:30 AM EST by a video enabled telemedicine application and verified that I am speaking with the correct person using two identifiers.  Location: Patient: home Provider: office   I discussed the limitations of evaluation and management by telemedicine and the availability of in person appointments. The patient expressed understanding and agreed to proceed.  Follow Up Instructions:    I discussed the assessment and treatment plan with the patient. The patient was provided an opportunity to ask questions and all were answered. The patient agreed with the plan and demonstrated an understanding of the instructions.   The patient was advised to call back or seek an in-person evaluation if the symptoms worsen or if the condition fails to improve as anticipated.  I provided 30 minutes of non-face-to-face time during this encounter.   Anson Fret, MD   09/12/2023: patient following up for migraines, hemiplegic. At last appointment we spent an extended visit discussing migraines, migraine management, migraine aura, hemiplegic migraines, pathophysiology, acute and preventative medical management, lifestyle factors and genetics of hemiplegic migraines. Also discussed risk of stroke with migraine aura.  We ordered MRI of the brain and MRA of the head which were unremarkable.  CBC, CMP, hemoglobin A1c, TSH were normal lipid panel showed elevated triglycerides 237 otherwise normal hemoglobin A1c was normal 5.  We started on Qulipta for prevention and Ubrelvy for acute management.  She is here today for follow-up. Bennie Pierini was approved, Bernita Raisin had difficulty wit the PA. She did not take the [place] was not approved. But the Topiramata is  extremely helping and she has lost weight which she is happy about. Not drinking soda. She is happy with her migraines. But has noticed her depressiona nd anxiety worsening and husband has noticed can be a side effect of Topiramate. Side effects to the IR, change to ER  next we can try Zonisamide. Patient prefers to stay on Topiramate IR. However she stopped Zoloft at the same time she started Topiramate. Will start Effexor which is good for mood as well as a headache medication. Keep on the Topiramate IR. We can increae the Effexor up to 75mg  then 150mg . She can mychart me.    Reviewed notes, labs and imaging from outside physicians, which showed:  09/05/2023: MRI brain and MRA head:   IMPRESSION: Unremarkable MRI scan of the brain with and without contrast.  Incidental findings of chronic severe paranasal sinusitis and prominent adenoid hypertrophy is noted in posterior nasopharynx.    IMPRESSION: Unremarkable MR angiogram study of the brain without contrast showing no significant narrowing of the large and medium size intracranial vessels.   Recent Results (from the past 2160 hour(s))  TSH Rfx on Abnormal to Free T4     Status: None   Collection Time: 07/13/23  4:06 PM  Result Value Ref Range   TSH 2.600 0.450 - 4.500 uIU/mL  Hemoglobin A1c     Status: None   Collection Time: 07/13/23  4:06 PM  Result Value Ref Range   Hgb A1c MFr Bld 5.0 4.8 - 5.6 %    Comment:          Prediabetes: 5.7 - 6.4          Diabetes: >6.4  Glycemic control for adults with diabetes: <7.0    Est. average glucose Bld gHb Est-mCnc 97 mg/dL  Lipid Panel     Status: Abnormal   Collection Time: 07/13/23  4:06 PM  Result Value Ref Range   Cholesterol, Total 138 100 - 199 mg/dL   Triglycerides 161 (H) 0 - 149 mg/dL   HDL 51 >09 mg/dL   VLDL Cholesterol Cal 37 5 - 40 mg/dL   LDL Chol Calc (NIH) 50 0 - 99 mg/dL   Chol/HDL Ratio 2.7 0.0 - 4.4 ratio    Comment:                                   T.  Chol/HDL Ratio                                             Men  Women                               1/2 Avg.Risk  3.4    3.3                                   Avg.Risk  5.0    4.4                                2X Avg.Risk  9.6    7.1                                3X Avg.Risk 23.4   11.0   Comprehensive metabolic panel     Status: None   Collection Time: 07/13/23  4:06 PM  Result Value Ref Range   Glucose 84 70 - 99 mg/dL   BUN 12 6 - 20 mg/dL   Creatinine, Ser 6.04 0.57 - 1.00 mg/dL   eGFR 540 >98 JX/BJY/7.82   BUN/Creatinine Ratio 19 9 - 23   Sodium 141 134 - 144 mmol/L   Potassium 4.7 3.5 - 5.2 mmol/L   Chloride 105 96 - 106 mmol/L   CO2 22 20 - 29 mmol/L   Calcium 9.1 8.7 - 10.2 mg/dL   Total Protein 6.3 6.0 - 8.5 g/dL   Albumin 4.2 4.0 - 5.0 g/dL   Globulin, Total 2.1 1.5 - 4.5 g/dL   Bilirubin Total <9.5 0.0 - 1.2 mg/dL   Alkaline Phosphatase 83 44 - 121 IU/L   AST 15 0 - 40 IU/L   ALT 17 0 - 32 IU/L  CBC with Differential/Platelets     Status: None   Collection Time: 07/13/23  4:06 PM  Result Value Ref Range   WBC 8.8 3.4 - 10.8 x10E3/uL   RBC 4.40 3.77 - 5.28 x10E6/uL   Hemoglobin 12.8 11.1 - 15.9 g/dL   Hematocrit 62.1 30.8 - 46.6 %   MCV 89 79 - 97 fL   MCH 29.1 26.6 - 33.0 pg   MCHC 32.7 31.5 - 35.7 g/dL   RDW 65.7 84.6 - 96.2 %   Platelets 346 150 - 450 x10E3/uL   Neutrophils 70  Not Estab. %   Lymphs 18 Not Estab. %   Monocytes 9 Not Estab. %   Eos 2 Not Estab. %   Basos 1 Not Estab. %   Neutrophils Absolute 6.2 1.4 - 7.0 x10E3/uL   Lymphocytes Absolute 1.6 0.7 - 3.1 x10E3/uL   Monocytes Absolute 0.8 0.1 - 0.9 x10E3/uL   EOS (ABSOLUTE) 0.1 0.0 - 0.4 x10E3/uL   Basophils Absolute 0.1 0.0 - 0.2 x10E3/uL   Immature Granulocytes 0 Not Estab. %   Immature Grans (Abs) 0.0 0.0 - 0.1 x10E3/uL     Patient complains of symptoms per HPI as well as the following symptoms: none . Pertinent negatives and positives per HPI. All others negative     HPI  07/13/2023:  Tracy Glenn is a 22 y.o. female here as requested by Hadley Pen, MD for Migraines. has Migraine without aura, intractable; Tension headache; Medication overuse headache; Circadian rhythm sleep disorder, irregular sleep wake type; Anxiety state; Depression; Migraine with aura; Esophageal reflux; Allergic urticaria; Allergic reaction; Migraine without aura and without status migrainosus, not intractable; and Hemiplegic migraine without status migrainosus, not intractable on their problem list.   She has 6 kids, 3 of her own 5 and under and 3 older she is raising. She has been worsening for at least a year ib the setting of a large family but now that thngs are better still having worse migraines, triggers are loud persistent noise and smells can make worse or trigger, she has nausea, pulsating/pounding/throbbing on the left unilateral. She has hemiplegic migraines she has had them multiple times she gets a left-sided headache and right-sided numbness and weakness and aphasia, can;t et a sentence out, her eye become glassy and then followed by a severe migraine, loses peripheral vision like seieng through a straw, one week she had 3 of the same episodes. She had hemiplegic the first time with her 24 year old she was [redacted] weeks pregnant. Benadryl, zofran, advil dual action,  She gets headaches 10 days a month. She gets a total of migraine 6 days a month which includes hemiplegic migraines 2x a month. Migraines can be moderate to severe, last 4 hours to 24 hours,  She is not planning on any other children. They use condoms and never have sex without birth control and aren't planning on more children for at least 3 years. No other focal neurologic deficits, associated symptoms, inciting events or modifiable factors.  Reviewed notes, labs and imaging from outside physicians, which showed:  Number thorough review of records and patient self-reports, medications tried > 3 months: propranolol,  prozac, magnesium, zofran, sertraline, topiramate. Triptans are contraindicated due to hemiplegic migraines. Imitrex.      Latest Ref Rng & Units 07/13/2023    4:06 PM 07/25/2016    1:40 PM  CBC  WBC 3.4 - 10.8 x10E3/uL 8.8  7.8   Hemoglobin 11.1 - 15.9 g/dL 40.9  81.1   Hematocrit 34.0 - 46.6 % 39.1  38.5   Platelets 150 - 450 x10E3/uL 346  249       Latest Ref Rng & Units 07/13/2023    4:06 PM 07/25/2016    1:40 PM  CMP  Glucose 70 - 99 mg/dL 84  97   BUN 6 - 20 mg/dL 12  <5   Creatinine 9.14 - 1.00 mg/dL 7.82  9.56   Sodium 213 - 144 mmol/L 141  141   Potassium 3.5 - 5.2 mmol/L 4.7  3.9   Chloride 96 -  106 mmol/L 105  111   CO2 20 - 29 mmol/L 22  25   Calcium 8.7 - 10.2 mg/dL 9.1  8.7   Total Protein 6.0 - 8.5 g/dL 6.3  6.2   Total Bilirubin 0.0 - 1.2 mg/dL <1.6  0.4   Alkaline Phos 44 - 121 IU/L 83  37   AST 0 - 40 IU/L 15  15   ALT 0 - 32 IU/L 17  10      Review of Systems: Patient complains of symptoms per HPI as well as the following symptoms none. Pertinent negatives and positives per HPI. All others negative.   Social History   Socioeconomic History   Marital status: Single    Spouse name: Not on file   Number of children: Not on file   Years of education: Not on file   Highest education level: Not on file  Occupational History   Not on file  Tobacco Use   Smoking status: Never    Passive exposure: Yes   Smokeless tobacco: Never  Vaping Use   Vaping status: Never Used  Substance and Sexual Activity   Alcohol use: No   Drug use: No   Sexual activity: Yes  Other Topics Concern   Not on file  Social History Narrative   Caffiene 1 soda daily   Working home maker  3 kids (5,3,2y)   Watches 3 other children (nieces/nephews)   Stressors: family (brother and sister substance abuse)    Social Determinants of Health   Financial Resource Strain: Not on file  Food Insecurity: Low Risk  (07/25/2023)   Received from [facility]   Hunger Vital Sign     Worried About Running Out of Food in the Last Year: Never true    Ran Out of Food in the Last Year: Never true  Transportation Needs: No Transportation Needs (07/25/2023)   Received from [facility]    In the past 12 months, has lack of reliable transportation kept you from medical appointments, meetings, work or from getting things needed for daily living? : No  Physical Activity: Not on file  Stress: Not on file  Social Connections: Not on file  Intimate Partner Violence: Not At Risk (12/10/2021)   Received from [facility] visits prior to 12/10/2022., [facility] visits prior to 12/10/2022.   Humiliation, Afraid, Rape, and Kick questionnaire    Fear of Current or Ex-Partner: No    Emotionally Abused: No    Physically Abused: No    Sexually Abused: No    Family History  Problem Relation Age of Onset   Migraines Father    Bipolar disorder Sister    Migraines Brother    Migraines Paternal Grandfather    Depression Cousin    Asthma Cousin    Migraines Paternal Aunt    Allergic rhinitis Maternal Uncle     Past Medical History:  Diagnosis Date   Anxiety and depression    Asthma    Migraines    POTS (postural orthostatic tachycardia syndrome)    Urticaria     Patient Active Problem List   Diagnosis Date Noted   Migraine without aura and without status migrainosus, not intractable 07/16/2023   Hemiplegic migraine without status migrainosus, not intractable 07/16/2023   Esophageal reflux 08/26/2015   Allergic urticaria 08/26/2015   Allergic reaction 08/26/2015   Migraine without aura, intractable 01/28/2014   Tension headache 01/28/2014   Medication overuse headache  01/28/2014   Circadian rhythm sleep disorder, irregular sleep wake type 01/28/2014   Anxiety state 01/28/2014   Depression 01/28/2014   Migraine with aura 01/28/2014    Past Surgical History:  Procedure Laterality Date   WISDOM TOOTH EXTRACTION       Current Outpatient Medications  Medication Sig Dispense Refill   topiramate (TOPAMAX) 100 MG tablet Take 1 tablet (100 mg total) by mouth at bedtime. 90 tablet 4   venlafaxine XR (EFFEXOR XR) 37.5 MG 24 hr capsule Take 1 capsule (37.5 mg total) by mouth daily with breakfast. 30 capsule 11   albuterol (PROAIR HFA) 108 (90 BASE) MCG/ACT inhaler Inhale 2 puffs into the lungs every 4 (four) hours as needed for wheezing or shortness of breath.     diphenhydrAMINE (BENADRYL) 25 mg capsule Take 25 mg by mouth every 6 (six) hours as needed.     ibuprofen (ADVIL,MOTRIN) 600 MG tablet Take 600 mg by mouth every 6 (six) hours as needed.     loratadine (CLARITIN) 10 MG tablet Take 1 tablet (10 mg total) by mouth 2 (two) times daily. 60 tablet 5   Melatonin 3 MG TABS Take 3 mg by mouth at bedtime as needed.     ondansetron (ZOFRAN-ODT) 4 MG disintegrating tablet dissolve 1 tablet ON TONGUE every 6 hours if needed for nausea and vomiting  0   Prenatal Vit-Fe Fumarate-FA (PRENATAL MULTIVITAMIN) TABS tablet Take 1 tablet daily at 12 noon by mouth.     riboflavin (VITAMIN B-2) 100 MG TABS tablet Take 100 mg by mouth daily.     No current facility-administered medications for this visit.    Allergies as of 09/12/2023 - Review Complete 09/12/2023  Allergen Reaction Noted   Fluoxetine  07/13/2023   Other  01/28/2014   Zithromax [azithromycin] Hives 08/24/2017   Amoxicillin Rash 01/28/2014    Vitals: There were no vitals taken for this visit. Last Weight:  Wt Readings from Last 1 Encounters:  07/13/23 190 lb 12.8 oz (86.5 kg)   Last Height:   Ht Readings from Last 1 Encounters:  07/13/23 5\' 2"  (1.575 m)    Physical exam: Exam: Gen: NAD, conversant      CV: No palpitations or chest pain or SOB. VS: Breathing at a normal rate. Not febrile. Eyes: Conjunctivae clear without exudates or hemorrhage  Neuro: Detailed Neurologic Exam  Speech:    Speech is normal; fluent and spontaneous with  normal comprehension.  Cognition:    The patient is oriented to person, place, and time;     recent and remote memory intact;     language fluent;     normal attention, concentration, fund of knowledge Cranial Nerves:    The pupils are equal, round, and reactive to light. Visual fields are full Extraocular movements are intact.  The face is symmetric with normal sensation. The palate elevates in the midline. Hearing intact. Voice is normal. Shoulder shrug is normal. The tongue has normal motion without fasciculations.   Coordination: normal  Gait:    No abnormalities noted or reported  Motor Observation:   no involuntary movements noted. Tone:    Appears normal  Posture:    Posture is normal. normal erect    Strength:    Strength is anti-gravity and symmetric in the upper and lower limbs.      Sensation: intact to LT, no reports of numbness or tingling or paresthesias        Assessment/Plan: Patient with hemiplegic migraine, at last visit  we spent an extended visit today discussing migraines, migraine management, migraine aura, hemiplegic migraines, pathophysiology, acute and preventative medical management, lifestyle factors and genetics of hemiplegic migraines. Started her on Topiramate and also had qulipta approved.  Patient never started qulipta/ubrelvy because the topiramate is working well. She is doing well on the Topiramate. But when she started Topiramate she had just prior stopped Zoloft. Now she feels her anxiety and depression are worse. This can be a side effect of Topiramate however more likely because she stopped her SSRI. Will continue Topiramate and start effexor(can increase if she requests) as effexor also has evidence for migraine efficacy as well as mood.    Meds ordered this encounter  Medications   topiramate (TOPAMAX) 100 MG tablet    Sig: Take 1 tablet (100 mg total) by mouth at bedtime.    Dispense:  90 tablet    Refill:  4   venlafaxine XR (EFFEXOR  XR) 37.5 MG 24 hr capsule    Sig: Take 1 capsule (37.5 mg total) by mouth daily with breakfast.    Dispense:  30 capsule    Refill:  11   To prevent or relieve headaches, try the following: Cool Compress. Lie down and place a cool compress on your head.  Avoid headache triggers. If certain foods or odors seem to have triggered your migraines in the past, avoid them. A headache diary might help you identify triggers.  Include physical activity in your daily routine. Try a daily walk or other moderate aerobic exercise.  Manage stress. Find healthy ways to cope with the stressors, such as delegating tasks on your to-do list.  Practice relaxation techniques. Try deep breathing, yoga, massage and visualization.  Eat regularly. Eating regularly scheduled meals and maintaining a healthy diet might help prevent headaches. Also, drink plenty of fluids.  Follow a regular sleep schedule. Sleep deprivation might contribute to headaches Consider biofeedback. With this mind-body technique, you learn to control certain bodily functions -- such as muscle tension, heart rate and blood pressure -- to prevent headaches or reduce headache pain.    Proceed to emergency room if you experience new or worsening symptoms or symptoms do not resolve, if you have new neurologic symptoms or if headache is severe, or for any concerning symptom.   Provided education and documentation from American headache Society toolbox including articles on: chronic migraine medication overuse headache, chronic migraines, prevention of migraines, behavioral and other nonpharmacologic treatments for headache.   Discussed:  There is increased risk for stroke in women with migraine with aura and a contraindication for the combined contraceptive pill for use by women who have migraine with aura. The risk for women with migraine without aura is lower. However other risk factors like smoking are far more likely to increase stroke risk than  migraine. There is a recommendation for no smoking and for the use of OCPs without estrogen such as progestogen only pills particularly for women with migraine with aura.Marland Kitchen People who have migraine headaches with auras may be 3 times more likely to have a stroke caused by a blood clot, compared to migraine patients who don't see auras. Women who take hormone-replacement therapy may be 30 percent more likely to suffer a clot-based stroke than women not taking medication containing estrogen. Other risk factors like smoking and high blood pressure may be  much more important. And stroke is still a rare complication due to migraine aura and is controversial and lower doses may not cause a risk.  When discussing birth control remember to mention migraine auras.     Cc: Hadley Pen, MD,  Hadley Pen, MD  Naomie Dean, MD  [facility] [address] [place], [place] [address]  Phone [phone] Fax [phone]  I spent over 60 minutes of face-to-face and non-face-to-face time with patient on the  1. Migraine without aura and without status migrainosus, not intractable     diagnosis.  This included previsit chart review, lab review, study review, order entry, electronic health record documentation, patient education on the different diagnostic and therapeutic options, counseling and coordination of care, risks and benefits of management, compliance, or risk factor reduction

## 2023-09-12 NOTE — Patient Instructions (Signed)
Prior to pregnancy, would need to change Topamax and possibly effexor.  My chart me to increase or change the effexor would try cymbalta next  Venlafaxine Tablets(ffexor) What is this medication? VENLAFAXINE (VEN la fax een) treats depression and anxiety. It increases the amount of serotonin and norepinephrine in the brain, hormones that help regulate mood. It belongs to a group of medications called SNRIs. This medicine may be used for other purposes; ask your health care provider or pharmacist if you have questions. COMMON BRAND NAME(S): Effexor What should I tell my care team before I take this medication? They need to know if you have any of these conditions: Bleeding disorders Glaucoma Heart disease High blood pressure High cholesterol Kidney disease Liver disease Low levels of sodium in the blood Mania or bipolar disorder Seizures Suicidal thoughts, plans, or attempt by you or a family member Take medications that treat or prevent blood clots Thyroid disease An unusual or allergic reaction to venlafaxine, other medications, foods, dyes, or preservatives Pregnant or trying to get pregnant Breastfeeding How should I use this medication? Take this medication by mouth with a glass of water. Take it as directed on the prescription label. Take it with food. Take your medication at regular intervals. Do not take your medication more often than directed. Keep taking this medication unless your care team tells you to stop. Stopping it too quickly can cause serious side effects. It can also make your condition worse. A special MedGuide will be given to you by the pharmacist with each prescription and refill. Be sure to read this information carefully each time. Talk to your care team about the use of this medication in children. Special care may be needed. Overdosage: If you think you have taken too much of this medicine contact a poison control center or emergency room at once. NOTE: This  medicine is only for you. Do not share this medicine with others. What if I miss a dose? If you miss a dose, take it as soon as you can. If it is almost time for your next dose, take only that dose. Do not take double or extra doses. What may interact with this medication? Do not take this medication with any of the following: Certain medications for fungal infections, such as fluconazole, itraconazole, ketoconazole, posaconazole, voriconazole Cisapride Desvenlafaxine Dronedarone Duloxetine Levomilnacipran Linezolid MAOIs, such as Carbex, Eldepryl, Marplan, Nardil, and Parnate Methylene blue (injected into a vein) Milnacipran Pimozide Thioridazine This medication may also interact with the following: Amphetamines Aspirin and aspirin-like medications Certain medications for mental health conditions Certain medications for migraine headaches, such as almotriptan, eletriptan, frovatriptan, naratriptan, rizatriptan, sumatriptan, zolmitriptan Certain medications for sleep Certain medications that treat or prevent blood clots, such as dalteparin, enoxaparin, warfarin Cimetidine Clozapine Diuretics Fentanyl Furazolidone Indinavir Isoniazid Lithium Metoprolol NSAIDS, medications for pain and inflammation, such as ibuprofen or naproxen Other medications that cause heart rhythm changes Procarbazine Rasagiline Supplements, such as St. John's Wort, kava kava, valerian Tramadol Tryptophan This list may not describe all possible interactions. Give your health care provider a list of all the medicines, herbs, non-prescription drugs, or dietary supplements you use. Also tell them if you smoke, drink alcohol, or use illegal drugs. Some items may interact with your medicine. What should I watch for while using this medication? Tell your care team if your symptoms do not get better or if they get worse. Visit your care team for regular checks on your progress. Because it may take several weeks  to see  the full effects of this medication, it is important to continue your treatment as prescribed by your care team. Watch for new or worsening thoughts of suicide or depression. This includes sudden changes in mood, behaviors, or thoughts. These changes can happen at any time but are more common in the beginning of treatment or after a change in dose. Call your care team right away if you experience these thoughts or worsening depression. This medication may cause mood and behavior changes, such as anxiety, nervousness, irritability, hostility, restlessness, excitability, hyperactivity, or trouble sleeping. These changes can happen at any time but are more common in the beginning of treatment or after a change in dose. Call your care team right away if you notice any of these symptoms. This medication can cause an increase in blood pressure. Check with your care team for instructions on monitoring your blood pressure while taking this medication. This medication may affect your coordination, reaction time, or judgment. Do not drive or operate machinery until you know how this medication affects you. Sit up or stand slowly to reduce the risk of dizzy or fainting spells. Drinking alcohol with this medication can increase the risk of these side effects. Your mouth may get dry. Chewing sugarless gum or sucking hard candy and drinking plenty of water may help. Contact your care team if the problem does not go away or is severe. What side effects may I notice from receiving this medication? Side effects that you should report to your care team as soon as possible: Allergic reactions--skin rash, itching, hives, swelling of the face, lips, tongue, or throat Bleeding--bloody or black, tar-like stools, red or dark brown urine, vomiting blood or brown material that looks like coffee grounds, small, red or purple spots on skin, unusual bleeding or bruising Heart rhythm changes--fast or irregular heartbeat,  dizziness, feeling faint or lightheaded, chest pain, trouble breathing Increase in blood pressure Loss of appetite with weight loss Low sodium level--muscle weakness, fatigue, dizziness, headache, confusion Serotonin syndrome--irritability, confusion, fast or irregular heartbeat, muscle stiffness, twitching muscles, sweating, high fever, seizures, chills, vomiting, diarrhea Sudden eye pain or change in vision such as blurry vision, seeing halos around lights, vision loss Thoughts of suicide or self-harm, worsening mood, feelings of depression Side effects that usually do not require medical attention (report to your care team if they continue or are bothersome): Anxiety, nervousness Change in sex drive or performance Dizziness Dry mouth Excessive sweating Nausea Tremors or shaking Trouble sleeping This list may not describe all possible side effects. Call your doctor for medical advice about side effects. You may report side effects to FDA at 1-800-FDA-1088. Where should I keep my medication? Keep out of the reach of children and pets. Store at a controlled temperature between 20 and 25 degrees C (68 and 77 degrees F), in a dry place. Throw away any unused medication after the expiration date. NOTE: This sheet is a summary. It may not cover all possible information. If you have questions about this medicine, talk to your doctor, pharmacist, or health care provider.  2024 [employer]/Gold Standard (2022-09-22 00:00:00) Topiramate Tablets What is this medication? TOPIRAMATE (toe PYRE a mate) prevents and controls seizures in people with epilepsy. It may also be used to prevent migraine headaches. It works by calming overactive nerves in your body. This medicine may be used for other purposes; ask your health care provider or pharmacist if you have questions. COMMON BRAND NAME(S): Topamax, Topiragen What should I tell my care team before I  take this medication? They need to know if you have  any of these conditions: Bleeding disorder Kidney disease Lung disease Suicidal thoughts, plans, or attempt by you or a family member An unusual or allergic reaction to topiramate, other medications, foods, dyes, or preservatives Pregnant or trying to get pregnant Breast-feeding How should I use this medication? Take this medication by mouth with water. Take it as directed on the prescription label at the same time every day. Do not cut, crush or chew this medicine. Swallow the tablets whole. You can take it with or without food. If it upsets your stomach, take it with food. Keep taking it unless your care team tells you to stop. A special MedGuide will be given to you by the pharmacist with each prescription and refill. Be sure to read this information carefully each time. Talk to your care team about the use of this medication in children. While it may be prescribed for children as young as 2 years for selected conditions, precautions do apply. Overdosage: If you think you have taken too much of this medicine contact a poison control center or emergency room at once. NOTE: This medicine is only for you. Do not share this medicine with others. What if I miss a dose? If you miss a dose, take it as soon as you can unless it is within 6 hours of the next dose. If it is within 6 hours of the next dose, skip the missed dose. Take the next dose at the normal time. Do not take double or extra doses. What may interact with this medication? Acetazolamide Alcohol Antihistamines for allergy, cough, and cold Aspirin and aspirin-like medications Atropine Certain medications for anxiety or sleep Certain medications for bladder problems, such as oxybutynin, tolterodine Certain medications for depression, such as amitriptyline, fluoxetine, sertraline Certain medications for Parkinson disease, such as benztropine, trihexyphenidyl Certain medications for seizures, such as carbamazepine, lamotrigine,  phenobarbital, phenytoin, primidone, valproic acid, zonisamide Certain medications for stomach problems, such as dicyclomine, hyoscyamine Certain medications for travel sickness, such as scopolamine Certain medications that treat or prevent blood clots, such as warfarin, enoxaparin, dalteparin, apixaban, dabigatran, rivaroxaban Digoxin Diltiazem Estrogen and progestin hormones General anesthetics, such as halothane, isoflurane, methoxyflurane, propofol Glyburide Hydrochlorothiazide Ipratropium Lithium Medications that relax muscles Metformin NSAIDs, medications for pain and inflammation, such as ibuprofen or naproxen Opioid medications for pain Phenothiazines, such as chlorpromazine, mesoridazine, prochlorperazine, thioridazine Pioglitazone This list may not describe all possible interactions. Give your health care provider a list of all the medicines, herbs, non-prescription drugs, or dietary supplements you use. Also tell them if you smoke, drink alcohol, or use illegal drugs. Some items may interact with your medicine. What should I watch for while using this medication? Visit your care team for regular checks on your progress. Tell your care team if your symptoms do not start to get better or if they get worse. Do not suddenly stop taking this medication. You may develop a severe reaction. Your care team will tell you how much medication to take. If your care team wants you to stop the medication, the dose may be slowly lowered over time to avoid any side effects. Wear a medical ID bracelet or chain. Carry a card that describes your condition. List the medications and doses you take on the card. This medication may affect your coordination, reaction time, or judgment. Do not drive or operate machinery until you know how this medication affects you. Sit up or stand slowly to reduce the  risk of dizzy or fainting spells. Drinking alcohol with this medication can increase the risk of these  side effects. This medication may cause serious skin reactions. They can happen weeks to months after starting the medication. Contact your care team right away if you notice fevers or flu-like symptoms with a rash. The rash may be red or purple and then turn into blisters or peeling of the skin. You may also notice a red rash with swelling of the face, lips, or lymph nodes in your neck or under your arms. This medication may cause thoughts of suicide or depression. This includes sudden changes in mood, behaviors, or thoughts. These changes can happen at any time but are more common in the beginning of treatment or after a change in dose. Call your care team right away if you experience these thoughts or worsening depression. This medication may slow your child's growth if it is taken for a long time at high doses. Your child's care team will monitor your child's growth. Using this medication for a long time may weaken your bones. The risk of bone fractures may be increased. Talk to your care team about your bone health. Discuss this medication with your care team if you may be pregnant. Serious birth defects can occur if you take this medication during pregnancy. There are benefits and risks to taking medications during pregnancy. Your care team can help you find the option that works for you. Contraception is recommended while taking this medication. Estrogen and progestin hormones may not work as well while you are taking this medication. Your care team can help you find the option that works for you. Talk to your care team before breastfeeding. Changes to your treatment plan may be needed. What side effects may I notice from receiving this medication? Side effects that you should report to your care team as soon as possible: Allergic reactions--skin rash, itching, hives, swelling of the face, lips, tongue, or throat High acid level--trouble breathing, unusual weakness or fatigue, confusion, headache,  fast or irregular heartbeat, nausea, vomiting High ammonia level--unusual weakness or fatigue, confusion, loss of appetite, nausea, vomiting, seizures Fever that does not go away, decrease in sweat Kidney stones--blood in the urine, pain or trouble passing urine, pain in the lower back or sides Redness, blistering, peeling or loosening of the skin, including inside the mouth Sudden eye pain or change in vision such as blurry vision, seeing halos around lights, vision loss Thoughts of suicide or self-harm, worsening mood, feelings of depression Side effects that usually do not require medical attention (report to your care team if they continue or are bothersome): Burning or tingling sensation in hands or feet Difficulty with paying attention, memory, or speech Dizziness Drowsiness Fatigue Loss of appetite with weight loss Slow or sluggish movements of the body This list may not describe all possible side effects. Call your doctor for medical advice about side effects. You may report side effects to FDA at 1-800-FDA-1088. Where should I keep my medication? Keep out of the reach of children and pets. Store between 15 and 30 degrees C (59 and 86 degrees F). Protect from moisture. Keep the container tightly closed. Get rid of any unused medication after the expiration date. To get rid of medications that are no longer needed or have expired: Take the medication to a medication take-back program. Check with your pharmacy or law enforcement to find a location. If you cannot return the medication, check the label or package insert to see  if the medication should be thrown out in the garbage or flushed down the toilet. If you are not sure, ask your care team. If it is safe to put it in the trash, empty the medication out of the container. Mix the medication with cat litter, dirt, coffee grounds, or other unwanted substance. Seal the mixture in a bag or container. Put it in the trash. NOTE: This sheet  is a summary. It may not cover all possible information. If you have questions about this medicine, talk to your doctor, pharmacist, or health care provider.  2024 [employer]/Gold Standard (2022-02-17 00:00:00)

## 2023-09-12 NOTE — Telephone Encounter (Signed)
Scheduled 03/14/24 for a VV with Shanda Bumps.

## 2023-11-02 ENCOUNTER — Encounter: Payer: Self-pay | Admitting: Neurology

## 2023-11-09 ENCOUNTER — Other Ambulatory Visit: Payer: Self-pay | Admitting: Neurology

## 2023-11-09 DIAGNOSIS — G43009 Migraine without aura, not intractable, without status migrainosus: Secondary | ICD-10-CM

## 2023-11-09 MED ORDER — VENLAFAXINE HCL ER 75 MG PO CP24: 75.0000 mg | ORAL_CAPSULE | Freq: Every day | ORAL | 3 refills | Status: DC

## 2024-03-13 NOTE — Progress Notes (Unsigned)
 [facility] [address] [place]. [place] [address] [phone]       OFFICE FOLLOW UP NOTE  [name] Date of Birth:  [birth year] Medical Record Number:  [id number]    Primary neurologist: Dr. Tresia Fruit Reason for visit: migraine  Virtual Visit via Video Note  I connected with Verlon Go on 03/14/24 at  2:15 PM EDT by a video enabled telemedicine application and verified that I am speaking with the correct person using two identifiers.  Location: Patient: in car, parked, in [place] Provider: in office, [facility]   I discussed the limitations of evaluation and management by telemedicine and the availability of in person appointments. The patient expressed understanding and agreed to proceed.   SUBJECTIVE:    HPI:    Update 03/14/2024 JM: Patient returns for 40-month follow-up visit for treatment of migraine headaches.  At prior visit, she continued on topiramate  100 mg nightly and was started on venlafaxine  37.5 mg daily for anxiety and migraine efficacy.  Venlafaxine  dosage increased in January to 75 mg due to return of daily headaches.  Denies any improvement of anxiety on higher dosage and felt initiating venlafaxine  worsened headaches therefore she self discontinued.  Thankfully, she has not had any recent migraine headaches on topiramate  100 mg nightly.  She does continue to struggle with anxiety and stress which she believes triggers tension headaches about 1-2 times per week, will use Tylenol  with benefit.  She questions use of other antidepressant/antianxiety medications, reports she has been on multiple previously without great benefit.  She is still breast-feeding her 61-year-old son but is in the process of self weaning.     History provided from prior OV note from Dr. Tresia Fruit for reference purposes only Update 09/12/2023 Dr. Tresia Fruit:: patient following up for migraines, hemiplegic. At last appointment we spent an extended visit discussing migraines,  migraine management, migraine aura, hemiplegic migraines, pathophysiology, acute and preventative medical management, lifestyle factors and genetics of hemiplegic migraines. Also discussed risk of stroke with migraine aura.  We ordered MRI of the brain and MRA of the head which were unremarkable.  CBC, CMP, hemoglobin A1c, TSH were normal lipid panel showed elevated triglycerides 237 otherwise normal hemoglobin A1c was normal 5.  We started on Qulipta  for prevention and Ubrelvy  for acute management.  She is here today for follow-up. Qulipta  was approved, ubrelvy  had difficulty wit the PA. She did not take the Qulipta  and the ubrelvy  was not approved. But the Topiramata is extremely helping and she has lost weight which she is happy about. Not drinking soda. She is happy with her migraines. But has noticed her depressiona nd anxiety worsening and husband has noticed can be a side effect of Topiramate . Side effects to the IR, change to ER  next we can try Zonisamide. Patient prefers to stay on Topiramate  IR. However she stopped Zoloft at the same time she started Topiramate . Will start Effexor  which is good for mood as well as a headache medication. Keep on the Topiramate  IR. We can increae the Effexor  up to 75mg  then 150mg . She can mychart me.      Reviewed notes, labs and imaging from outside physicians, which showed:   09/05/2023: MRI brain and MRA head:    IMPRESSION: Unremarkable MRI scan of the brain with and without contrast.  Incidental findings of chronic severe paranasal sinusitis and prominent adenoid hypertrophy is noted in posterior nasopharynx.     IMPRESSION: Unremarkable MR angiogram study of the brain without contrast  showing no significant narrowing of the large and medium size intracranial vessels.     Consult visit 07/13/2023 Dr. Almedia Arista is a 23 y.o. female here as requested by Melva Stabile, MD for Migraines. has Migraine without aura, intractable; Tension headache;  Medication overuse headache; Circadian rhythm sleep disorder, irregular sleep wake type; Anxiety state; Depression; Migraine with aura; Esophageal reflux; Allergic urticaria; Allergic reaction; Migraine without aura and without status migrainosus, not intractable; and Hemiplegic migraine without status migrainosus, not intractable on their problem list.     She has 6 kids, 3 of her own 5 and under and 3 older she is raising. She has been worsening for at least a year ib the setting of a large family but now that thngs are better still having worse migraines, triggers are loud persistent noise and smells can make worse or trigger, she has nausea, pulsating/pounding/throbbing on the left unilateral. She has hemiplegic migraines she has had them multiple times she gets a left-sided headache and right-sided numbness and weakness and aphasia, can;t et a sentence out, her eye become glassy and then followed by a severe migraine, loses peripheral vision like seieng through a straw, one week she had 3 of the same episodes. She had hemiplegic the first time with her 25 year old she was [redacted] weeks pregnant. Benadryl, zofran, advil dual action,  She gets headaches 10 days a month. She gets a total of migraine 6 days a month which includes hemiplegic migraines 2x a month. Migraines can be moderate to severe, last 4 hours to 24 hours,  She is not planning on any other children. They use condoms and never have sex without birth control and aren't planning on more children for at least 3 years. No other focal neurologic deficits, associated symptoms, inciting events or modifiable factors.   Reviewed notes, labs and imaging from outside physicians, which showed:   Number thorough review of records and patient self-reports, medications tried > 3 months: propranolol , prozac, magnesium, zofran, sertraline, topiramate , venlafaxine . Triptans are contraindicated due to hemiplegic migraines. Imitrex.       ROS:   14 system  review of systems performed and negative with exception of those listed in HPI  PMH:  Past Medical History:  Diagnosis Date   Anxiety and depression    Asthma    Migraines    POTS (postural orthostatic tachycardia syndrome)    Urticaria     PSH:  Past Surgical History:  Procedure Laterality Date   WISDOM TOOTH EXTRACTION      Social History:  Social History   Socioeconomic History   Marital status: Single    Spouse name: Not on file   Number of children: Not on file   Years of education: Not on file   Highest education level: Not on file  Occupational History   Not on file  Tobacco Use   Smoking status: Never    Passive exposure: Yes   Smokeless tobacco: Never  Vaping Use   Vaping status: Never Used  Substance and Sexual Activity   Alcohol use: No   Drug use: No   Sexual activity: Yes  Other Topics Concern   Not on file  Social History Narrative   Caffiene 1 soda daily   Working home maker  3 kids (5,3,2y)   Watches 3 other children (nieces/nephews)   Stressors: family (brother and sister substance abuse)    Social Drivers of Corporate investment banker Strain: Not on [employer]  Insecurity: Low Risk  (07/25/2023)   Received from [facility]   Hunger Vital Sign    Worried About Running Out of Food in the Last Year: Never true    Ran Out of Food in the Last Year: Never true  Transportation Needs: No Transportation Needs (07/25/2023)   Received from [facility]    In the past 12 months, has lack of reliable transportation kept you from medical appointments, meetings, work or from getting things needed for daily living? : No  Physical Activity: Not on file  Stress: Not on file  Social Connections: Not on file  Intimate Partner Violence: Not At Risk (12/10/2021)   Received from [facility] visits prior to 12/10/2022., [facility] visits prior to 12/10/2022.   Humiliation, Afraid, Rape, and Kick  questionnaire    Fear of Current or Ex-Partner: No    Emotionally Abused: No    Physically Abused: No    Sexually Abused: No    Family History:  Family History  Problem Relation Age of Onset   Migraines Father    Bipolar disorder Sister    Migraines Brother    Migraines Paternal Grandfather    Depression Cousin    Asthma Cousin    Migraines Paternal Aunt    Allergic rhinitis Maternal Uncle     Medications:   Current Outpatient Medications on File Prior to Visit  Medication Sig Dispense Refill   albuterol (PROAIR HFA) 108 (90 BASE) MCG/ACT inhaler Inhale 2 puffs into the lungs every 4 (four) hours as needed for wheezing or shortness of breath.     diphenhydrAMINE (BENADRYL) 25 mg capsule Take 25 mg by mouth every 6 (six) hours as needed.     ibuprofen (ADVIL,MOTRIN) 600 MG tablet Take 600 mg by mouth every 6 (six) hours as needed.     loratadine  (CLARITIN ) 10 MG tablet Take 1 tablet (10 mg total) by mouth 2 (two) times daily. 60 tablet 5   Melatonin 3 MG TABS Take 3 mg by mouth at bedtime as needed.     ondansetron (ZOFRAN-ODT) 4 MG disintegrating tablet dissolve 1 tablet ON TONGUE every 6 hours if needed for nausea and vomiting  0   Prenatal Vit-Fe Fumarate-FA (PRENATAL MULTIVITAMIN) TABS tablet Take 1 tablet daily at 12 noon by mouth.     riboflavin (VITAMIN B-2) 100 MG TABS tablet Take 100 mg by mouth daily.     topiramate  (TOPAMAX ) 100 MG tablet Take 1 tablet (100 mg total) by mouth at bedtime. 90 tablet 4   venlafaxine  XR (EFFEXOR  XR) 75 MG 24 hr capsule Take 1 capsule (75 mg total) by mouth daily with breakfast. 90 capsule 3   No current facility-administered medications on file prior to visit.    Allergies:   Allergies  Allergen Reactions   Fluoxetine     Suicidal thoughts   Other     Seasonal Allergies   Zithromax [Azithromycin] Hives   Amoxicillin Rash      OBJECTIVE:  Physical Exam  General: well developed, well nourished, very pleasant young Caucasian  female, seated, in no evident distress  Neurologic Exam Mental Status: Awake and fully alert. Oriented to place and time. Recent and remote memory intact. Attention span, concentration and fund of knowledge appropriate. Mood and affect appropriate.         ASSESSMENT/PLAN: Joselyn D Monforte is a 23 y.o. year old female with chronic migraine headaches which are currently very well-controlled without  any recent migraine headaches.  She does continue to struggle with tension type headaches about 1-2 times per week due to underlying stress and anxiety.      Chronic migraine headaches:  Continue topiramate  100 mg nightly - refill provided Continue Tylenol  as needed  Tension headaches Generalized anxiety Recommend initiating bupropion 150 mg daily - reports she has previously been on bupropion with benefit in regards to anxiety Did discuss potential risk of medications while breast-feeding but based on review of [employer] "bupropion doses of up to 300 mg daily produce low levels in breastmilk and would not be expected to cause any adverse effects in breastfed infants".  Her son is 100 years old and working on weaning. Patient verbalized understanding and wishes to proceed.    Follow up in 7 months or call earlier if needed   CC:  PCP: Melva Stabile, MD    I personally spent a total of 25 minutes in the care of the patient today including preparing to see the patient, counseling and educating, placing orders, and documenting clinical information in the EHR.This is our first time meeting and time has been spent reviewing past medical history and relevant medical records.  Johny Nap, AGNP-BC  [facility] [address] [place], [place] [address]  Phone [phone] Fax [phone] Note: This document was prepared with digital dictation and possible smart phrase technology. Any transcriptional errors that result from this process are  unintentional.

## 2024-03-14 ENCOUNTER — Telehealth: Payer: Medicaid Other | Admitting: Adult Health

## 2024-03-14 ENCOUNTER — Encounter: Payer: Self-pay | Admitting: Adult Health

## 2024-03-14 DIAGNOSIS — G43709 Chronic migraine without aura, not intractable, without status migrainosus: Secondary | ICD-10-CM

## 2024-03-14 DIAGNOSIS — F411 Generalized anxiety disorder: Secondary | ICD-10-CM

## 2024-03-14 DIAGNOSIS — F419 Anxiety disorder, unspecified: Secondary | ICD-10-CM

## 2024-03-14 DIAGNOSIS — G44209 Tension-type headache, unspecified, not intractable: Secondary | ICD-10-CM

## 2024-03-14 DIAGNOSIS — G43009 Migraine without aura, not intractable, without status migrainosus: Secondary | ICD-10-CM

## 2024-03-14 MED ORDER — TOPIRAMATE 100 MG PO TABS: 100.0000 mg | ORAL_TABLET | Freq: Every evening | ORAL | 4 refills | Status: AC

## 2024-03-14 MED ORDER — BUPROPION HCL ER (XL) 150 MG PO TB24: 150.0000 mg | ORAL_TABLET | Freq: Every day | ORAL | 11 refills | Status: AC

## 2024-05-13 ENCOUNTER — Encounter: Payer: Self-pay | Admitting: Neurology

## 2024-08-02 ENCOUNTER — Encounter: Payer: Self-pay | Admitting: Adult Health

## 2024-08-02 ENCOUNTER — Encounter: Payer: Self-pay | Admitting: *Deleted
